# Patient Record
Sex: Female | Born: 1950 | Smoking: Never smoker
Health system: Southern US, Community
[De-identification: ages and names within clinical notes are randomized; demographics above are authoritative.]

## PROBLEM LIST (undated history)

## (undated) HISTORY — PX: KNEE SURGERY: SHX244

---

## 2015-12-25 ENCOUNTER — Emergency Department (HOSPITAL_COMMUNITY)
Admission: EM | Admit: 2015-12-25 | Discharge: 2015-12-25 | Disposition: A | Discharge status: Home / Self Care | Payer: BLUE CROSS/BLUE SHIELD | Attending: Emergency Medicine | Admitting: Emergency Medicine

## 2015-12-25 ENCOUNTER — Encounter (HOSPITAL_COMMUNITY): Payer: Self-pay

## 2015-12-25 ENCOUNTER — Emergency Department (HOSPITAL_COMMUNITY): Payer: BLUE CROSS/BLUE SHIELD

## 2015-12-25 DIAGNOSIS — Y9301 Activity, walking, marching and hiking: Secondary | ICD-10-CM | POA: Diagnosis not present

## 2015-12-25 DIAGNOSIS — S02401A Maxillary fracture, unspecified, initial encounter for closed fracture: Secondary | ICD-10-CM

## 2015-12-25 DIAGNOSIS — S62395A Other fracture of fourth metacarpal bone, left hand, initial encounter for closed fracture: Secondary | ICD-10-CM | POA: Diagnosis not present

## 2015-12-25 DIAGNOSIS — S80219A Abrasion, unspecified knee, initial encounter: Secondary | ICD-10-CM | POA: Insufficient documentation

## 2015-12-25 DIAGNOSIS — S50812A Abrasion of left forearm, initial encounter: Secondary | ICD-10-CM | POA: Diagnosis not present

## 2015-12-25 DIAGNOSIS — S62309A Unspecified fracture of unspecified metacarpal bone, initial encounter for closed fracture: Secondary | ICD-10-CM

## 2015-12-25 DIAGNOSIS — S0083XA Contusion of other part of head, initial encounter: Secondary | ICD-10-CM | POA: Diagnosis not present

## 2015-12-25 DIAGNOSIS — Y9289 Other specified places as the place of occurrence of the external cause: Secondary | ICD-10-CM | POA: Insufficient documentation

## 2015-12-25 DIAGNOSIS — S0993XA Unspecified injury of face, initial encounter: Secondary | ICD-10-CM | POA: Diagnosis present

## 2015-12-25 DIAGNOSIS — Y998 Other external cause status: Secondary | ICD-10-CM | POA: Diagnosis not present

## 2015-12-25 DIAGNOSIS — S0081XA Abrasion of other part of head, initial encounter: Secondary | ICD-10-CM | POA: Diagnosis not present

## 2015-12-25 DIAGNOSIS — S0240DA Maxillary fracture, left side, initial encounter for closed fracture: Secondary | ICD-10-CM | POA: Diagnosis not present

## 2015-12-25 DIAGNOSIS — W010XXA Fall on same level from slipping, tripping and stumbling without subsequent striking against object, initial encounter: Secondary | ICD-10-CM | POA: Diagnosis not present

## 2015-12-25 MED ORDER — HYDROCODONE-ACETAMINOPHEN 5-325 MG PO TABS: 1.0000 | ORAL_TABLET | ORAL | Status: AC | PRN

## 2015-12-25 NOTE — ED Notes (Signed)
PT has abrasion under left eye.  And left wrist.  Pt alert and oriented x's 3

## 2015-12-25 NOTE — ED Notes (Signed)
Pt reports she was walking outside and slipped on a piece of tar and fell forward onto her face and caught herself by left arm. She has hematoma to left orbital area as well as some scratches. She also reports left wrist pain. Denies being on blood thinners and denies LOC. Pt A&OX4.

## 2015-12-25 NOTE — Discharge Instructions (Signed)
Please contact ENT specialist and orthopedist for follow-up evaluation in 1 week. If any new or worsening signs or symptoms present please return immediately to the emergency room for further evaluation.  Cast or Splint Care Casts and splints support injured limbs and keep bones from moving while they heal.  HOME CARE  Keep the cast or splint uncovered during the drying period.  A plaster cast can take 24 to 48 hours to dry.  A fiberglass cast will dry in less than 1 hour.  Do not rest the cast on anything harder than a pillow for 24 hours.  Do not put weight on your injured limb. Do not put pressure on the cast. Wait for your doctor's approval.  Keep the cast or splint dry.  Cover the cast or splint with a plastic bag during baths or wet weather.  If you have a cast over your chest and belly (trunk), take sponge baths until the cast is taken off.  If your cast gets wet, dry it with a towel or blow dryer. Use the cool setting on the blow dryer.  Keep your cast or splint clean. Wash a dirty cast with a damp cloth.  Do not put any objects under your cast or splint.  Do not scratch the skin under the cast with an object. If itching is a problem, use a blow dryer on a cool setting over the itchy area.  Do not trim or cut your cast.  Do not take out the padding from inside your cast.  Exercise your joints near the cast as told by your doctor.  Raise (elevate) your injured limb on 1 or 2 pillows for the first 1 to 3 days. GET HELP IF:  Your cast or splint cracks.  Your cast or splint is too tight or too loose.  You itch badly under the cast.  Your cast gets wet or has a soft spot.  You have a bad smell coming from the cast.  You get an object stuck under the cast.  Your skin around the cast becomes red or sore.  You have new or more pain after the cast is put on. GET HELP RIGHT AWAY IF:  You have fluid leaking through the cast.  You cannot move your fingers or  toes.  Your fingers or toes turn blue or white or are cool, painful, or puffy (swollen).  You have tingling or lose feeling (numbness) around the injured area.  You have bad pain or pressure under the cast.  You have trouble breathing or have shortness of breath.  You have chest pain.   This information is not intended to replace advice given to you by your health care provider. Make sure you discuss any questions you have with your health care provider.   Document Released: 01/10/2011 Document Revised: 05/13/2013 Document Reviewed: 03/19/2013 Elsevier Interactive Patient Education [employer]2016 [employer]

## 2015-12-25 NOTE — ED Provider Notes (Signed)
CSN: [id number]     Arrival date & time 12/25/15  1704 History  By signing my name below, I, [name], attest that this documentation has been prepared under the direction and in the presence of [employer], PA-C. Electronically Signed: [name], ED Scribe. 12/25/2015. 6:08 PM.    Chief Complaint  Patient presents with  . Fall   Patient is a 65 y.o. female presenting with fall. The history is provided by the patient. No language interpreter was used.  Fall    HPI Comments: Debbie Humphrey is a 65 y.o. female who presents to the Emergency Department complaining of fall onset today 5 hours PTA. Pt state that she tripped over the concrete and fell forward on her outstretched hands. Pt states that she fell on to her out strecthed hands and the left side of her face. Pt presents with bruising and abrasion to left cheek. Pt is complaining of left jaw pain and left wrist pain and left pinky finger pain. Pt states she has tried Advil with no relief. Pt denies LOC. Pt denies anticoagulant use.     History reviewed. No pertinent past medical history. Past Surgical History  Procedure Laterality Date  . Knee surgery Left    No family history on file. Social History  Substance Use Topics  . Smoking status: Never Smoker   . Smokeless tobacco: None  . Alcohol Use: No   OB History    No data available     Review of Systems  All other systems reviewed and are negative.     Allergies  Morphine and related  Home Medications   Prior to Admission medications   Medication Sig Start Date End Date Taking? Authorizing Provider  HYDROcodone-acetaminophen (NORCO/VICODIN) 5-325 MG tablet Take 1 tablet by mouth every 4 (four) hours as needed. 12/25/15   [place] Dowd, PA-C   BP 136/71 mmHg  Pulse 75  Temp(Src) 98.2 F (36.8 C) (Oral)  Resp 16  SpO2 99%   Physical Exam  Constitutional: She is oriented to person, place, and time. She appears well-developed and well-nourished. No  distress.  HENT:  Superficial abrasion with surrounding bruising to the left cheek, TTP of the jaw around the TMJ,  dentition alignment normal, full active ROM of jaw.    Eyes: Conjunctivae and EOM are normal.  Neck: Neck supple. No tracheal deviation present.  Cardiovascular: Normal rate.   Pulmonary/Chest: Effort normal. No respiratory distress.  Musculoskeletal: Normal range of motion. She exhibits tenderness.  superficial abrasion to the left forearm with minor TTP of the wrist pain and decreased ability to flex the PIP of the left 4th digit with minor amount of swelling, sensation intact, NVI.  superificial abrasion to the anterior knee no gross deformities full active ROM. No C or T spine tenderness full active ROM of neck without pain   Neurological: She is alert and oriented to person, place, and time.  Skin: Skin is warm and dry.  Psychiatric: She has a normal mood and affect. Her behavior is normal.  Nursing note and vitals reviewed.   ED Course  Procedures (including critical care time) DIAGNOSTIC STUDIES: Oxygen Saturation is 99% on RA, normal by my interpretation.    COORDINATION OF CARE: 6:07 PM-Discussed treatment plan with pt at bedside and pt agreed to plan.     Labs Review Labs Reviewed - No data to display  Imaging Review Dg Wrist Complete Left  12/25/2015  CLINICAL DATA:  Fall today, pain left fourth finger and  left breast EXAM: LEFT WRIST - COMPLETE 3+ VIEW COMPARISON:  Concurrent hand radiographs FINDINGS: Known fracture left fourth metacarpal. See report of hand radiograph for details. No evidence of acute abnormality involving wrist joint. IMPRESSION: Fourth metacarpal fracture.  No other abnormalities. Electronically Signed   By: Esperanza Heiraymond  Rubner M.D.   On: 12/25/2015 18:25   Dg Hand Complete Left  12/25/2015  CLINICAL DATA:  Larey SeatFell today left fourth finger pain EXAM: LEFT HAND - COMPLETE 3+ VIEW COMPARISON:  None. FINDINGS: There is an oblique mildly  comminuted fracture of the mid to distal shaft of the fourth metacarpal. No significant displacement or angulation of fracture fragments. No other acute findings. IMPRESSION: Fourth metacarpal fracture. Electronically Signed   By: Esperanza Heiraymond  Rubner M.D.   On: 12/25/2015 18:23   Ct Maxillofacial Wo Cm  12/25/2015  CLINICAL DATA:  Fall today with left facial swelling, initial encounter EXAM: CT MAXILLOFACIAL WITHOUT CONTRAST TECHNIQUE: Multidetector CT imaging of the maxillofacial structures was performed. Multiplanar CT image reconstructions were also generated. A small metallic BB was placed on the right temple in order to reliably differentiate right from left. COMPARISON:  None. FINDINGS: Bony structures show a minimally displaced anterior wall fracture in the left maxillary antrum. There is some adjacent subcutaneous emphysema spreading along the left face consistent with this fracture. No other fractures are seen. No orbital blowout fracture is identified. The orbits and their contents are within normal limits. Minimal edematous changes are noted along the lateral aspect of the left orbit likely related to bruising. A C1 fusion defect is noted anteriorly. IMPRESSION: Minimally displaced fracture through the anterior wall of the left maxillary antrum. No other acute abnormality is noted. Electronically Signed   By: Alcide CleverMark  Lukens M.D.   On: 12/25/2015 19:09      EKG Interpretation None      MDM   Final diagnoses:  Closed fracture of maxilla, initial encounter (HCC)  Metacarpal bone fracture, closed, initial encounter     Labs:  Imaging:CT maxillofacial, DG hand  Consults:  Therapeutics:  Discharge Meds: Norco  Assessment/Plan: Patient presents with injuries status post fall. Most notably she has a minimally displaced fracture through the anterior wall of the left maxillary antrum. Patient has no associated complaints other than pain with this, no clear rhinorrhea, orbital entrapment, or  any signs of significant swelling. Patient also has a metacarpal fracture, as is comminuted, with no displacement or angulation. [place], will be flying out tomorrow. Both of these fractures do not need emergent follow-up evaluation, she is instructed to follow-up with her ENT and orthopedic specialist for further evaluation and management when she returns home. She is instructed to follow-up in the closest emergency room if any new or worsening signs or symptoms present. Patient was given care instructions, she verbalized her understanding and agreement to today's plan had no further questions or concerns at time of discharge         Eyvonne MechanicJeffrey Ramesses Crampton, PA-C 12/25/15 2247  Margarita Grizzleanielle Ray, MD 12/26/15 [id number]

## 2015-12-25 NOTE — Progress Notes (Signed)
Orthopedic Tech Progress Note Patient Details:  Debbie Humphrey 1951/02/02 [id number]  Ortho Devices Type of Ortho Device: Ace wrap, Ulna gutter splint Ortho Device/Splint Location: LUE Ortho Device/Splint Interventions: Ordered, Application   Debbie Humphrey, Debbie Humphrey 12/25/2015, 7:11 PM

## 2017-01-24 IMAGING — CT CT MAXILLOFACIAL W/O CM
3 series · 16 of 47 positions shown, 19 images · non-contrast
Comparison: None.

CLINICAL DATA: Fall today with left facial swelling, initial
encounter

EXAM:
CT MAXILLOFACIAL WITHOUT CONTRAST
TECHNIQUE: Multidetector CT imaging of the maxillofacial structures was
performed. Multiplanar CT image reconstructions were also generated.
A small metallic BB was placed on the right temple in order to
reliably differentiate right from left.

[Series 3: facial/ orbits 2.0 h30s · axial · 0.33mm/px · z∈[-188,-50]mm · 10 of 81 slices shown, 13 images]
[im 6/81  brain]
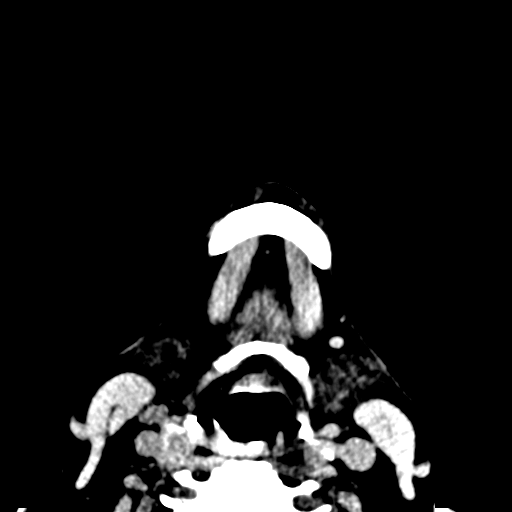
[im 6/81  bone]
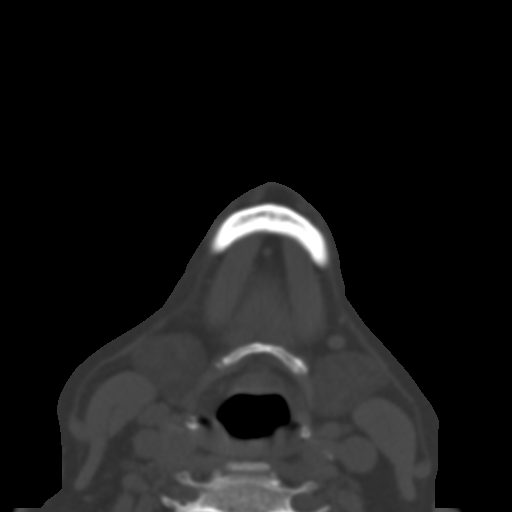
[im 14/81  bone]
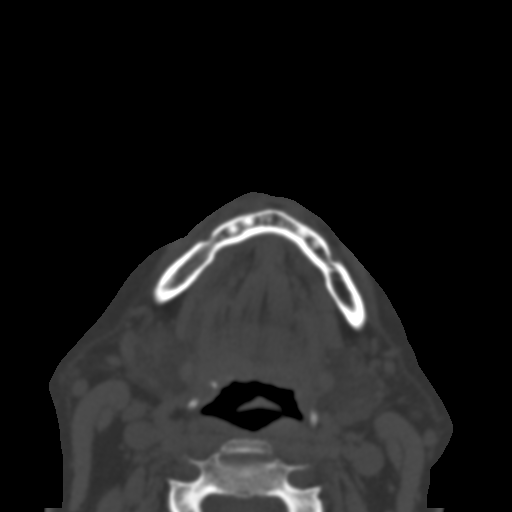
[im 23/81  bone]
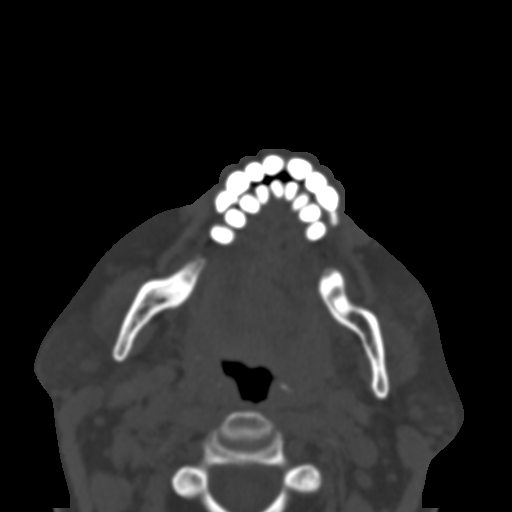
[im 28/81  bone]
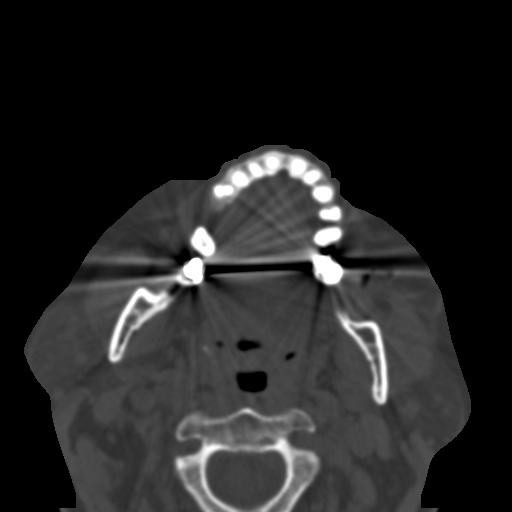
[im 36/81  brain]
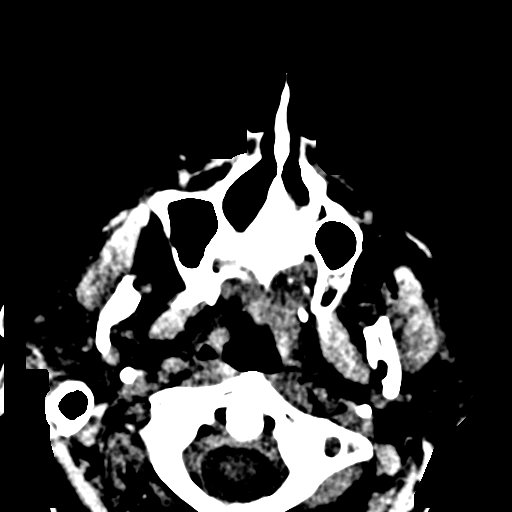
[im 36/81  bone]
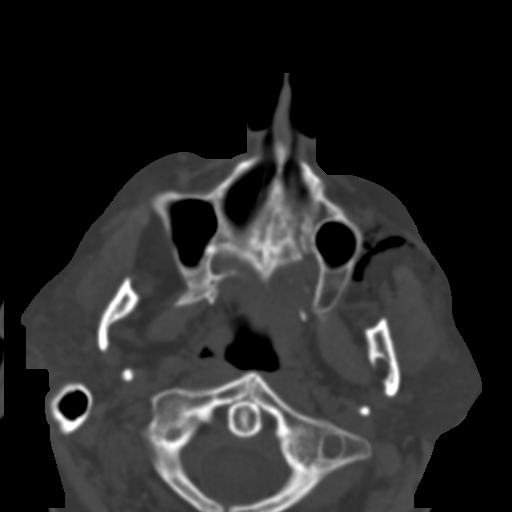
[im 45/81  bone]
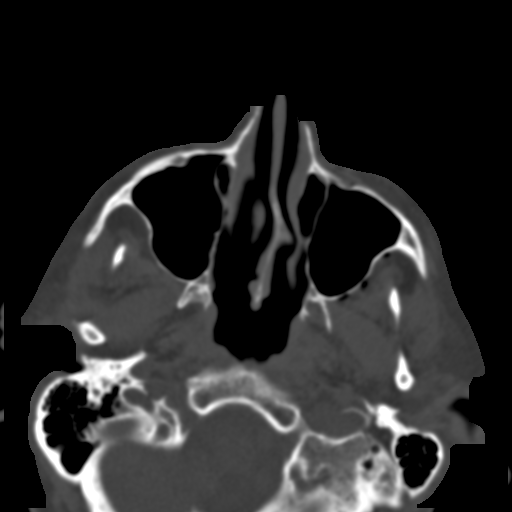
[im 53/81  bone]
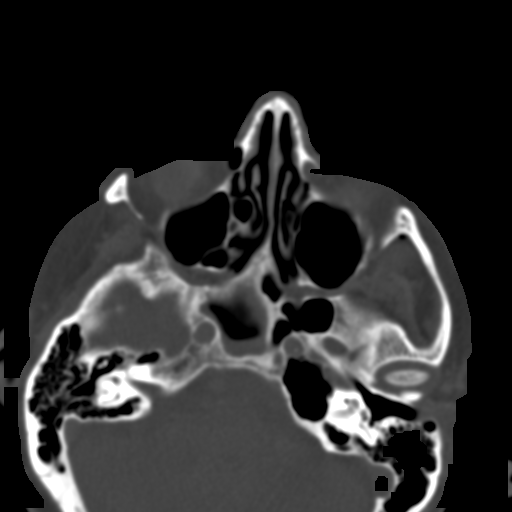
[im 61/81  bone]
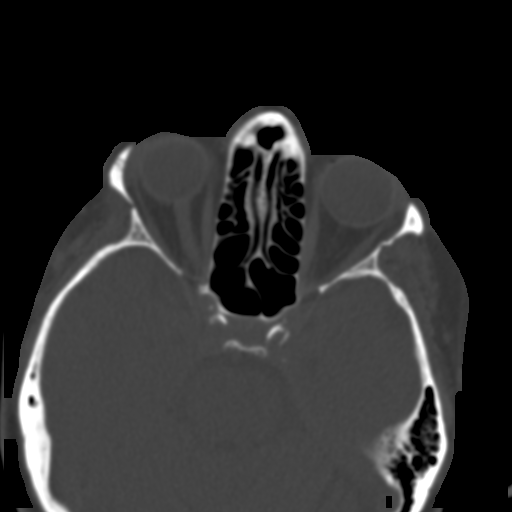
[im 67/81  brain]
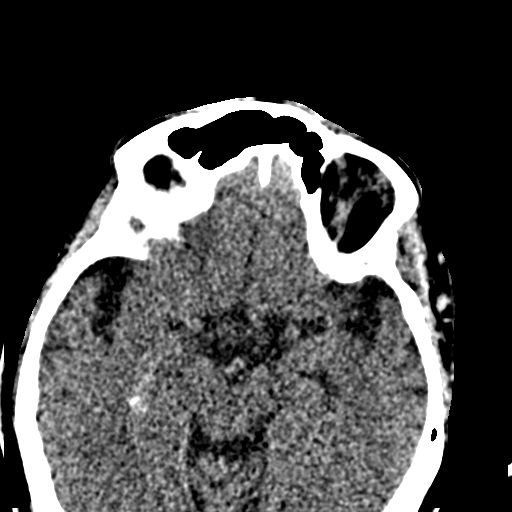
[im 67/81  bone]
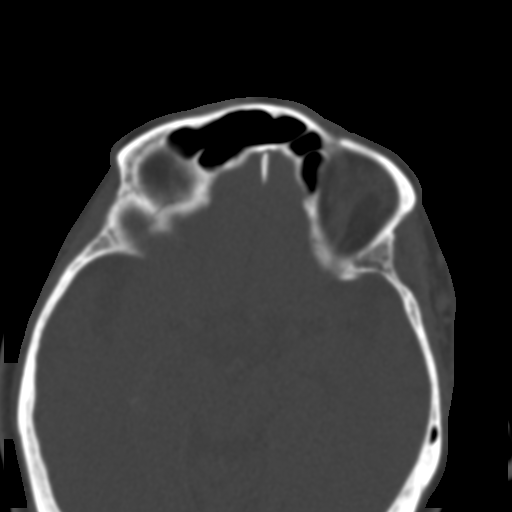
[im 75/81  bone]
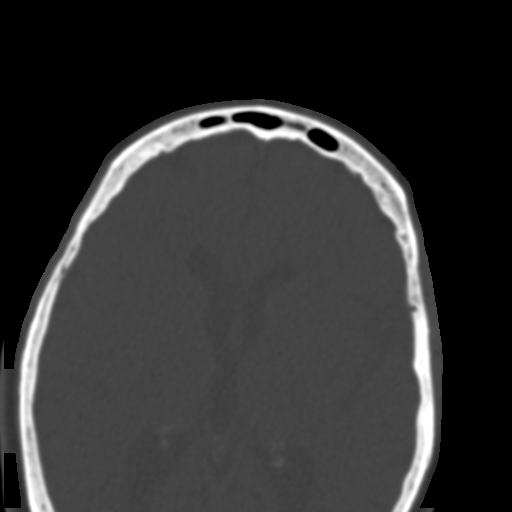

[Series 6: coronal soft tissue · coronal · 0.33mm/px · 3 of 73 slices shown]
[im 25/73  bone]
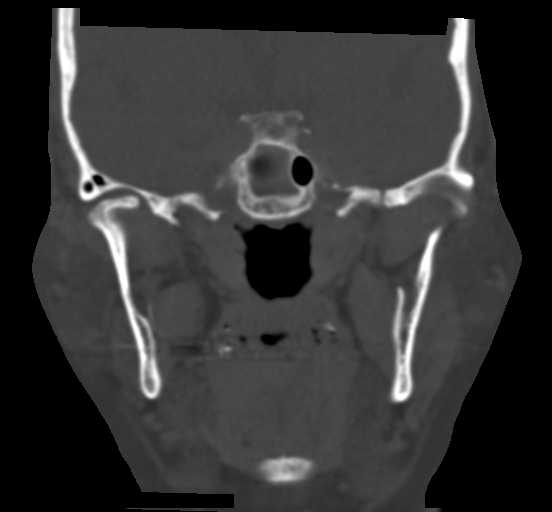
[im 33/73  bone]
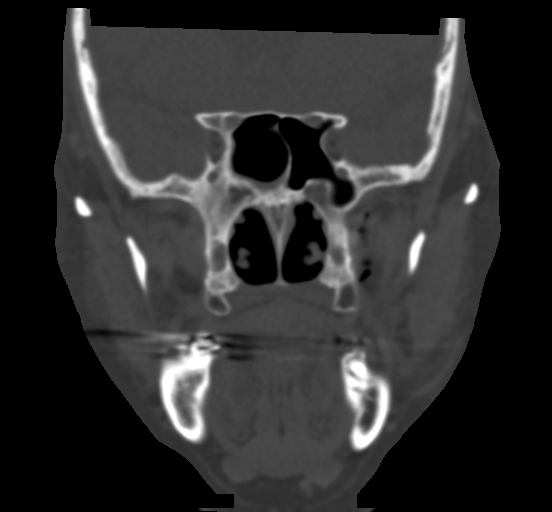
[im 41/73  bone]
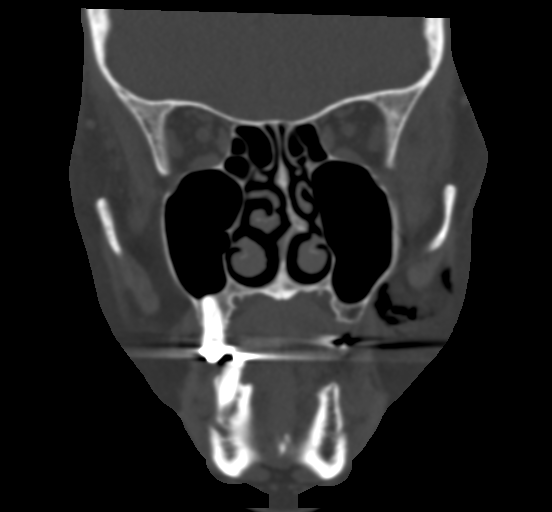

[Series 7: sagittal soft tissue · sagittal · 0.28mm/px · 3 of 91 slices shown]
[im 31/91  bone]
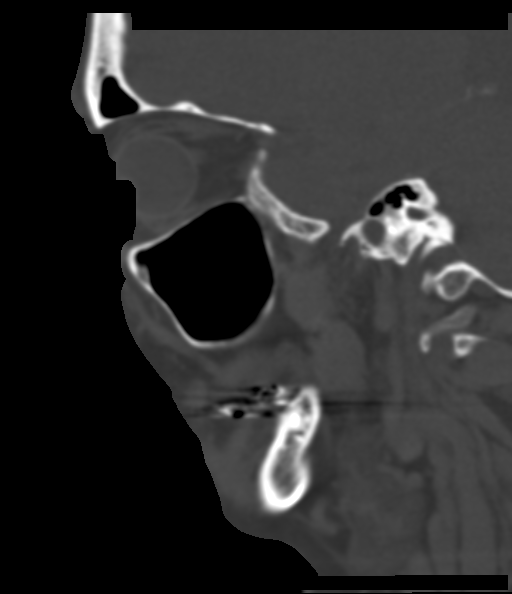
[im 46/91  bone]
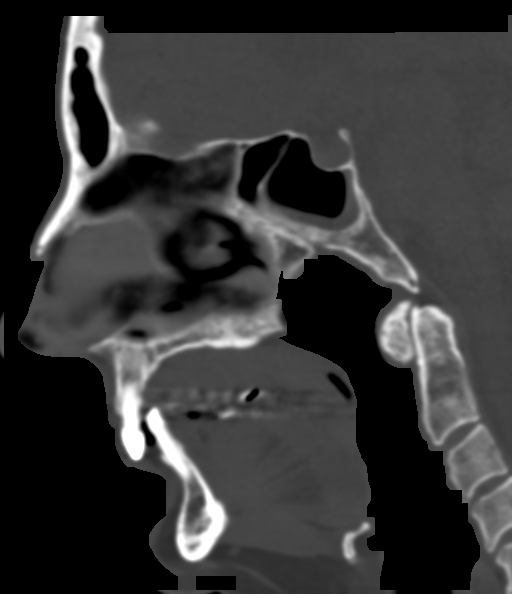
[im 61/91  bone]
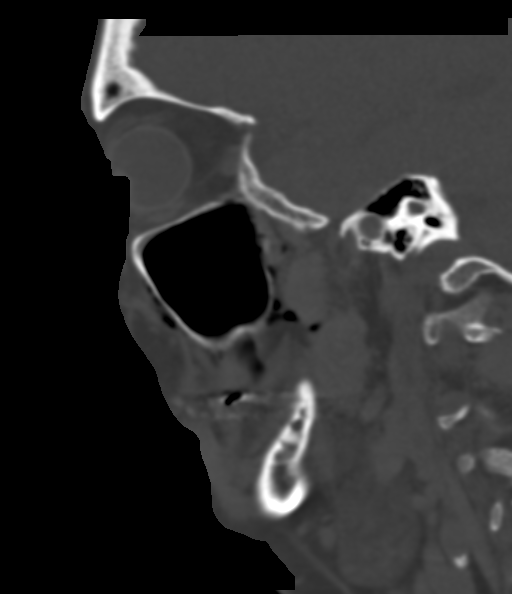

[16 of 47 positions shown; findings below may reference images not displayed]

FINDINGS: Bony structures show a minimally displaced anterior wall fracture in
the left maxillary antrum. There is some adjacent subcutaneous
emphysema spreading along the left face consistent with this
fracture. No other fractures are seen. No orbital blowout fracture
is identified. The orbits and their contents are within normal
limits. Minimal edematous changes are noted along the lateral aspect
of the left orbit likely related to bruising.. A C1 fusion defect is
noted anteriorly.
IMPRESSION: Minimally displaced fracture through the anterior wall of the left
maxillary antrum. No other acute abnormality is noted.
